# Patient Record
Sex: Male | Born: 1960 | Race: White | Hispanic: No | Marital: Married | Smoking: Former smoker
Health system: Southern US, Community
[De-identification: ages and names within clinical notes are randomized; demographics above are authoritative.]

## PROBLEM LIST (undated history)

## (undated) DIAGNOSIS — I10 Essential (primary) hypertension: Secondary | ICD-10-CM

## (undated) DIAGNOSIS — J45909 Unspecified asthma, uncomplicated: Secondary | ICD-10-CM

## (undated) DIAGNOSIS — J449 Chronic obstructive pulmonary disease, unspecified: Secondary | ICD-10-CM

---

## 2002-12-24 ENCOUNTER — Encounter: Admission: RE | Admit: 2002-12-24 | Discharge: 2002-12-24 | Payer: Self-pay | Admitting: Family Medicine

## 2002-12-24 ENCOUNTER — Encounter: Payer: Self-pay | Admitting: Family Medicine

## 2006-10-16 ENCOUNTER — Emergency Department: Payer: Self-pay | Admitting: Internal Medicine

## 2006-10-16 ENCOUNTER — Other Ambulatory Visit: Payer: Self-pay

## 2006-11-16 ENCOUNTER — Ambulatory Visit: Payer: Self-pay | Admitting: Gastroenterology

## 2011-12-13 ENCOUNTER — Ambulatory Visit: Payer: Self-pay | Admitting: Specialist

## 2013-05-30 ENCOUNTER — Ambulatory Visit: Payer: Self-pay | Admitting: Gastroenterology

## 2015-02-23 ENCOUNTER — Other Ambulatory Visit: Payer: Self-pay

## 2015-02-23 ENCOUNTER — Emergency Department: Payer: BLUE CROSS/BLUE SHIELD

## 2015-02-23 ENCOUNTER — Encounter: Payer: Self-pay | Admitting: Emergency Medicine

## 2015-02-23 ENCOUNTER — Emergency Department
Admission: EM | Admit: 2015-02-23 | Discharge: 2015-02-23 | Disposition: A | Discharge status: Home / Self Care | Payer: BLUE CROSS/BLUE SHIELD | Attending: Emergency Medicine | Admitting: Emergency Medicine

## 2015-02-23 DIAGNOSIS — R0981 Nasal congestion: Secondary | ICD-10-CM | POA: Diagnosis present

## 2015-02-23 DIAGNOSIS — J441 Chronic obstructive pulmonary disease with (acute) exacerbation: Secondary | ICD-10-CM | POA: Insufficient documentation

## 2015-02-23 DIAGNOSIS — I10 Essential (primary) hypertension: Secondary | ICD-10-CM | POA: Diagnosis not present

## 2015-02-23 DIAGNOSIS — J40 Bronchitis, not specified as acute or chronic: Secondary | ICD-10-CM

## 2015-02-23 HISTORY — DX: Essential (primary) hypertension: I10

## 2015-02-23 HISTORY — DX: Chronic obstructive pulmonary disease, unspecified: J44.9

## 2015-02-23 LAB — BASIC METABOLIC PANEL
Anion gap: 9 (ref 5–15)
BUN: 11 mg/dL (ref 6–20)
CHLORIDE: 103 mmol/L (ref 101–111)
CO2: 22 mmol/L (ref 22–32)
Calcium: 8.7 mg/dL — ABNORMAL LOW (ref 8.9–10.3)
Creatinine, Ser: 0.9 mg/dL (ref 0.61–1.24)
GFR calc Af Amer: >60 mL/min (ref >60)
Glucose, Bld: 121 mg/dL — ABNORMAL HIGH (ref 65–99)
POTASSIUM: 3.9 mmol/L (ref 3.5–5.1)
SODIUM: 134 mmol/L — AB (ref 135–145)

## 2015-02-23 LAB — CBC
HCT: 49.9 % (ref 40.0–52.0)
Hemoglobin: 17.1 g/dL (ref 13.0–18.0)
MCH: 32.5 pg (ref 26.0–34.0)
MCHC: 34.3 g/dL (ref 32.0–36.0)
MCV: 94.5 fL (ref 80.0–100.0)
Platelets: 146 10*3/uL — ABNORMAL LOW (ref 150–440)
RBC: 5.28 MIL/uL (ref 4.40–5.90)
RDW: 14.2 % (ref 11.5–14.5)
WBC: 6.6 10*3/uL (ref 3.8–10.6)

## 2015-02-23 MED ORDER — PREDNISONE 50 MG PO TABS: 50.0000 mg | ORAL_TABLET | Freq: Every day | ORAL | Status: AC

## 2015-02-23 MED ORDER — AZITHROMYCIN 500 MG PO TABS: 500.0000 mg | ORAL_TABLET | Freq: Every day | ORAL | Status: AC

## 2015-02-23 NOTE — ED Provider Notes (Signed)
[facility] Emergency Department Provider Note  ____________________________________________    I have reviewed the triage vital signs and the nursing notes.   HISTORY  Chief Complaint Shortness of Breath; Cough; and Nasal Congestion   HPI Tyler Dixon is a 54 y.o. male who presents with complaints of cough and nasal congestion. He reports he developed a cough approximately 2-3 weeks ago and felt better after antibiotics but it is returned now. He does admit to a history of smoking. Complains of some mild shortness of breath. No fevers no chills. He denies chest pain. The cough is productive     Past Medical History  Diagnosis Date  . COPD (chronic obstructive pulmonary disease)   . Hypertension     There are no active problems to display for this patient.   No past surgical history on file.  Current Outpatient Rx  Name  Route  Sig  Dispense  Refill  . azithromycin (ZITHROMAX) 500 MG tablet   Oral   Take 1 tablet (500 mg total) by mouth daily. Take 1 tablet daily for 3 days.   7 tablet   0   . predniSONE (DELTASONE) 50 MG tablet   Oral   Take 1 tablet (50 mg total) by mouth daily with breakfast.   7 tablet   0     Allergies Review of patient's allergies indicates no known allergies.  No family history on file.  Social History History  Substance Use Topics  . Smoking status: Former Games developer  . Smokeless tobacco: Not on file  . Alcohol Use: Yes    Review of Systems  Constitutional: Negative for fever. Eyes: Negative for visual changes. ENT: Negative for sore throat Cardiovascular: Negative for chest pain. Respiratory: Mild shortness of breath, and cough Gastrointestinal: Negative for abdominal pain, vomiting and diarrhea. Genitourinary: Negative for dysuria. Musculoskeletal: Negative for back pain. Skin: Negative for rash. Neurological: Negative for headaches or focal weakness Psychiatric: No  anxiety    ____________________________________________   PHYSICAL EXAM:  VITAL SIGNS: ED Triage Vitals  Enc Vitals Group     BP 02/23/15 1201 154/87 mmHg     Pulse Rate 02/23/15 1201 81     Resp 02/23/15 1201 18     Temp 02/23/15 1201 98.1 F (36.7 C)     Temp Source 02/23/15 1201 Oral     SpO2 02/23/15 1201 96 %     Weight 02/23/15 1201 310 lb (140.615 kg)     Height 02/23/15 1201 5\' 11"  (1.803 m)     Head Cir --      Peak Flow --      Pain Score --      Pain Loc --      Pain Edu? --      Excl. in GC? --      Constitutional: Alert and oriented. Well appearing and in no distress. Eyes: Conjunctivae are normal.  ENT   Head: Normocephalic and atraumatic.   Mouth/Throat: Mucous membranes are moist. Cardiovascular: Normal rate, regular rhythm. Normal and symmetric distal pulses are present in all extremities. No murmurs, rubs, or gallops. Respiratory: Normal respiratory effort without tachypnea nor retractions. Scattered wheezes Gastrointestinal: Soft and non-tender in all quadrants. No distention. There is no CVA tenderness. Genitourinary: deferred Musculoskeletal: Nontender with normal range of motion in all extremities. No lower extremity tenderness nor edema. Neurologic:  Normal speech and language. No gross focal neurologic deficits are appreciated. Skin:  Skin is warm, dry and intact. No rash noted. Psychiatric:  Mood and affect are normal. Patient exhibits appropriate insight and judgment.  ____________________________________________    LABS (pertinent positives/negatives)  Labs Reviewed  BASIC METABOLIC PANEL - Abnormal; Notable for the following:    Sodium 134 (*)    Glucose, Bld 121 (*)    Calcium 8.7 (*)    All other components within normal limits  CBC - Abnormal; Notable for the following:    Platelets 146 (*)    All other components within normal limits    ____________________________________________   EKG  ED ECG REPORT I, Jene Every, the attending physician, personally viewed and interpreted this ECG.  Date: 02/23/2015 EKG Time: 12:15 PM Rate: 77 Rhythm: normal sinus rhythm QRS Axis: normal Intervals: normal ST/T Wave abnormalities: normal Conduction Disutrbances: none Narrative Interpretation: unremarkable   ____________________________________________    RADIOLOGY I have personally reviewed any xrays that were ordered on this patient: Chest x-ray unremarkable   ____________________________________________   PROCEDURES  Procedure(s) performed: none  Critical Care performed: none  ____________________________________________   INITIAL IMPRESSION / ASSESSMENT AND PLAN / ED COURSE  Pertinent labs & imaging results that were available during my care of the patient were reviewed by me and considered in my medical decision making (see chart for details).  Patient well-appearing overall. We will give DuoNeb nebulized treatments in the emergency department. His blood pressure slightly elevated he does have a history of chronic hypertension. Last fall with PCP.  Will discharge him with prednisone and antibiotics with close follow-up as needed.  ____________________________________________   FINAL CLINICAL IMPRESSION(S) / ED DIAGNOSES  Final diagnoses:  Bronchitis     Jene Every, MD 02/23/15 [phone]

## 2015-02-23 NOTE — ED Notes (Signed)
C/o upper respiratory sxs cough and congestion.  Recently treated for bronchitis. Cough present.

## 2015-02-23 NOTE — Discharge Instructions (Signed)
Upper Respiratory Infection, Adult An upper respiratory infection (URI) is also sometimes known as the common cold. The upper respiratory tract includes the nose, sinuses, throat, trachea, and bronchi. Bronchi are the airways leading to the lungs. Most people improve within 1 week, but symptoms can last up to 2 weeks. A residual cough may last even longer.  CAUSES Many different viruses can infect the tissues lining the upper respiratory tract. The tissues become irritated and inflamed and often become very moist. Mucus production is also common. A cold is contagious. You can easily spread the virus to others by oral contact. This includes kissing, sharing a glass, coughing, or sneezing. Touching your mouth or nose and then touching a surface, which is then touched by another person, can also spread the virus. SYMPTOMS  Symptoms typically develop 1 to 3 days after you come in contact with a cold virus. Symptoms vary from person to person. They may include:  Runny nose.  Sneezing.  Nasal congestion.  Sinus irritation.  Sore throat.  Loss of voice (laryngitis).  Cough.  Fatigue.  Muscle aches.  Loss of appetite.  Headache.  Low-grade fever. DIAGNOSIS  You might diagnose your own cold based on familiar symptoms, since most people get a cold 2 to 3 times a year. Your caregiver can confirm this based on your exam. Most importantly, your caregiver can check that your symptoms are not due to another disease such as strep throat, sinusitis, pneumonia, asthma, or epiglottitis. Blood tests, throat tests, and X-rays are not necessary to diagnose a common cold, but they may sometimes be helpful in excluding other more serious diseases. Your caregiver will decide if any further tests are required. RISKS AND COMPLICATIONS  You may be at risk for a more severe case of the common cold if you smoke cigarettes, have chronic heart disease (such as heart failure) or lung disease (such as asthma), or if  you have a weakened immune system. The very young and very old are also at risk for more serious infections. Bacterial sinusitis, middle ear infections, and bacterial pneumonia can complicate the common cold. The common cold can worsen asthma and chronic obstructive pulmonary disease (COPD). Sometimes, these complications can require emergency medical care and may be life-threatening. PREVENTION  The best way to protect against getting a cold is to practice good hygiene. Avoid oral or hand contact with people with cold symptoms. Wash your hands often if contact occurs. There is no clear evidence that vitamin C, vitamin E, echinacea, or exercise reduces the chance of developing a cold. However, it is always recommended to get plenty of rest and practice good nutrition. TREATMENT  Treatment is directed at relieving symptoms. There is no cure. Antibiotics are not effective, because the infection is caused by a virus, not by bacteria. Treatment may include:  Increased fluid intake. Sports drinks offer valuable electrolytes, sugars, and fluids.  Breathing heated mist or steam (vaporizer or shower).  Eating chicken soup or other clear broths, and maintaining good nutrition.  Getting plenty of rest.  Using gargles or lozenges for comfort.  Controlling fevers with ibuprofen or acetaminophen as directed by your caregiver.  Increasing usage of your inhaler if you have asthma. Zinc gel and zinc lozenges, taken in the first 24 hours of the common cold, can shorten the duration and lessen the severity of symptoms. Pain medicines may help with fever, muscle aches, and throat pain. A variety of non-prescription medicines are available to treat congestion and runny nose. Your caregiver   can make recommendations and may suggest nasal or lung inhalers for other symptoms.  HOME CARE INSTRUCTIONS   Only take over-the-counter or prescription medicines for pain, discomfort, or fever as directed by your  caregiver.  Use a warm mist humidifier or inhale steam from a shower to increase air moisture. This may keep secretions moist and make it easier to breathe.  Drink enough water and fluids to keep your urine clear or pale yellow.  Rest as needed.  Return to work when your temperature has returned to normal or as your caregiver advises. You may need to stay home longer to avoid infecting others. You can also use a face mask and careful hand washing to prevent spread of the virus. SEEK MEDICAL CARE IF:   After the first few days, you feel you are getting worse rather than better.  You need your caregiver's advice about medicines to control symptoms.  You develop chills, worsening shortness of breath, or brown or red sputum. These may be signs of pneumonia.  You develop yellow or brown nasal discharge or pain in the face, especially when you bend forward. These may be signs of sinusitis.  You develop a fever, swollen neck glands, pain with swallowing, or white areas in the back of your throat. These may be signs of strep throat. SEEK IMMEDIATE MEDICAL CARE IF:   You have a fever.  You develop severe or persistent headache, ear pain, sinus pain, or chest pain.  You develop wheezing, a prolonged cough, cough up blood, or have a change in your usual mucus (if you have chronic lung disease).  You develop sore muscles or a stiff neck. Document Released: 12/28/2000 Document Revised: 09/26/2011 Document Reviewed: 10/09/2013 [employer] Patient Information 2015 [employer]. This information is not intended to replace advice given to you by your health care provider. Make sure you discuss any questions you have with your health care provider.  

## 2015-02-23 NOTE — ED Notes (Signed)
Patient reports for about the last week he has been having increased shortness of breath, cough, congestion with some production of yellow/white mucus. Patient reports history of bronchitis for which he has been treated but over the last week it seems to be getting worse. Patient has used nebulizer at home with little relief.

## 2015-05-25 ENCOUNTER — Other Ambulatory Visit: Payer: Self-pay | Admitting: Family Medicine

## 2015-05-25 ENCOUNTER — Ambulatory Visit
Admission: RE | Admit: 2015-05-25 | Discharge: 2015-05-25 | Disposition: A | Discharge status: Home / Self Care | Payer: BLUE CROSS/BLUE SHIELD | Source: Ambulatory Visit | Attending: Family Medicine | Admitting: Family Medicine

## 2015-05-25 DIAGNOSIS — D72825 Bandemia: Secondary | ICD-10-CM

## 2015-05-25 DIAGNOSIS — R1032 Left lower quadrant pain: Secondary | ICD-10-CM

## 2015-05-25 DIAGNOSIS — R10819 Abdominal tenderness, unspecified site: Secondary | ICD-10-CM | POA: Diagnosis present

## 2015-05-25 DIAGNOSIS — D72829 Elevated white blood cell count, unspecified: Secondary | ICD-10-CM | POA: Diagnosis present

## 2015-05-25 DIAGNOSIS — K5732 Diverticulitis of large intestine without perforation or abscess without bleeding: Secondary | ICD-10-CM | POA: Insufficient documentation

## 2015-05-25 DIAGNOSIS — K4090 Unilateral inguinal hernia, without obstruction or gangrene, not specified as recurrent: Secondary | ICD-10-CM | POA: Insufficient documentation

## 2015-05-25 DIAGNOSIS — R109 Unspecified abdominal pain: Secondary | ICD-10-CM | POA: Diagnosis present

## 2015-05-25 HISTORY — DX: Unspecified asthma, uncomplicated: J45.909

## 2015-05-25 MED ORDER — IOHEXOL 350 MG/ML SOLN
100.0000 mL | Freq: Once | INTRAVENOUS | Status: AC | PRN
  Administered 2015-05-25: 100 mL via INTRAVENOUS

## 2016-10-18 IMAGING — CT CT ABD-PELV W/ CM
1 of 3 series · 13 of 32 positions shown, 18 images · IV contrast (omnipaque)
Comparison: None.

CLINICAL DATA: Left lower quadrant and pelvic pain for 2-3 weeks.
Elevated white blood cell count.

EXAM:
CT ABDOMEN AND PELVIS WITH CONTRAST
TECHNIQUE: Multidetector CT imaging of the abdomen and pelvis was performed
using the standard protocol following bolus administration of
intravenous contrast.
CONTRAST:  100mL OMNIPAQUE IOHEXOL 350 MG/ML SOLN

[Series 2: routine abd pel with · axial · 0.94mm/px · z∈[-985,-565]mm · 13 of 94 slices shown, 18 images]
[im 5/94  soft-tissue]
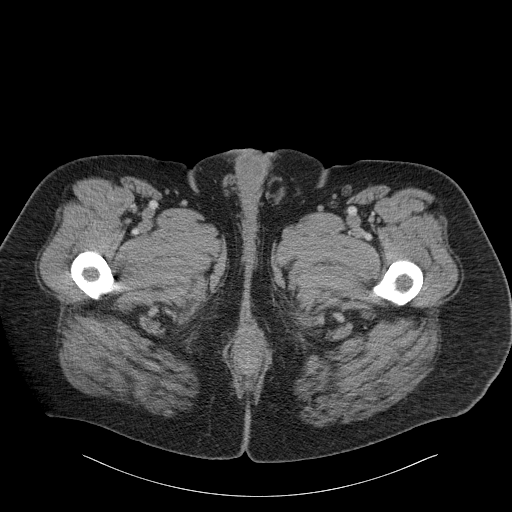
[im 5/94  bone]
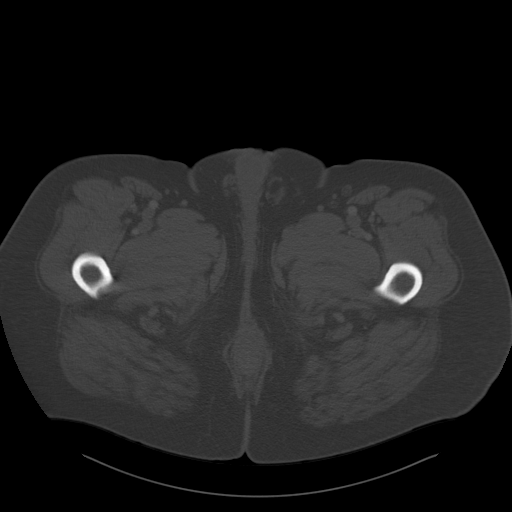
[im 15/94  soft-tissue]
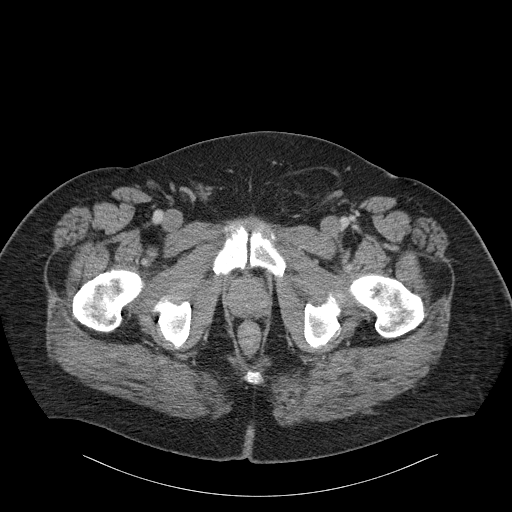
[im 20/94  soft-tissue]
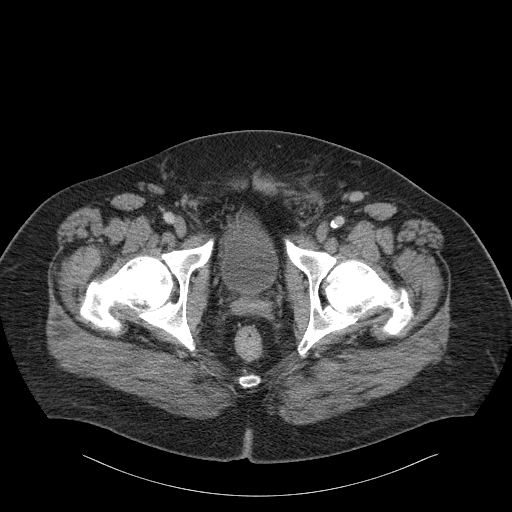
[im 30/94  soft-tissue]
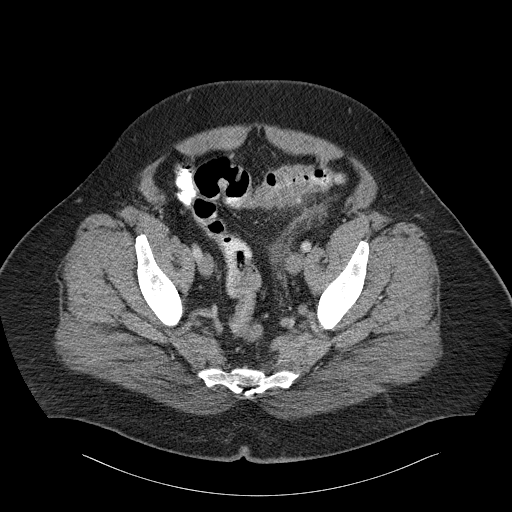
[im 35/94  soft-tissue]
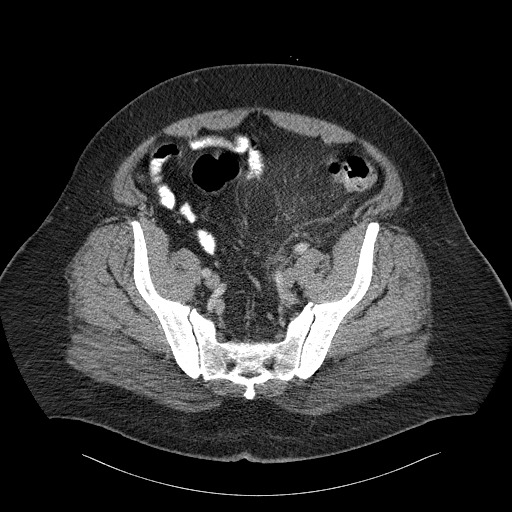
[im 45/94  soft-tissue]
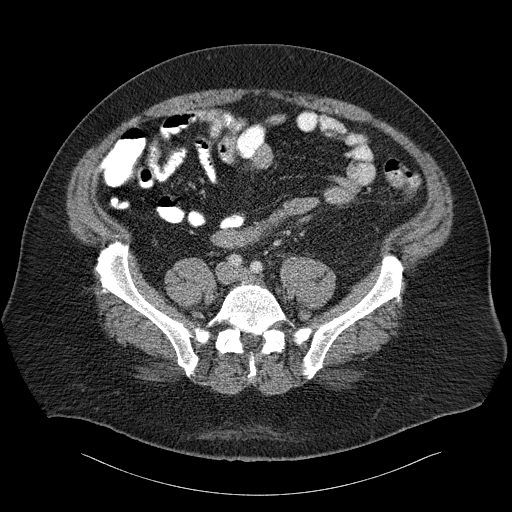
[im 49/94  soft-tissue]
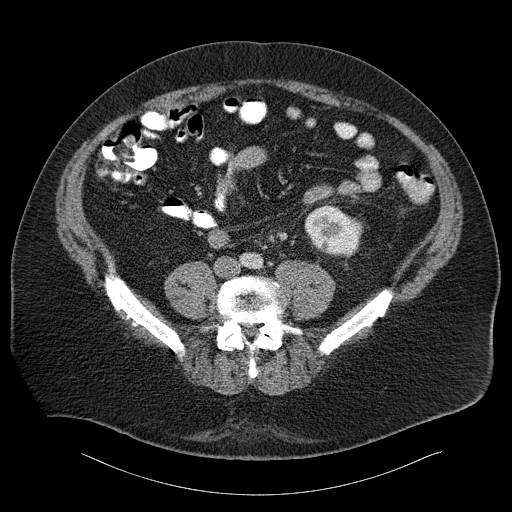
[im 59/94  soft-tissue]
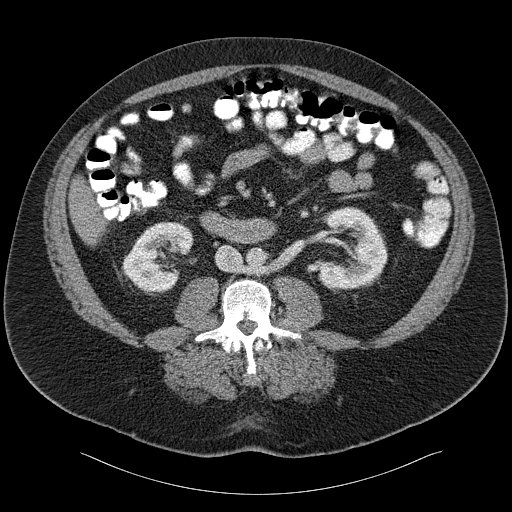
[im 64/94  soft-tissue]
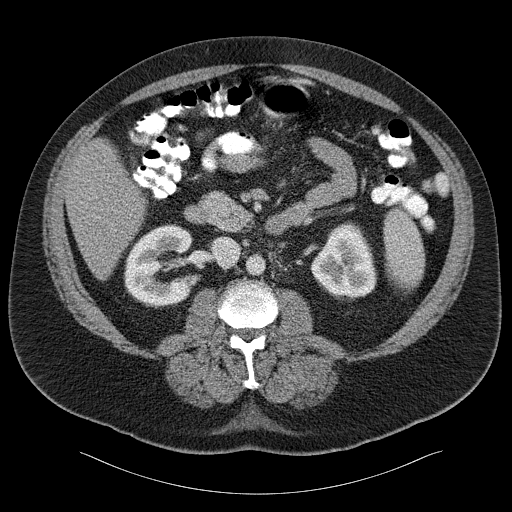
[im 64/94  bone]
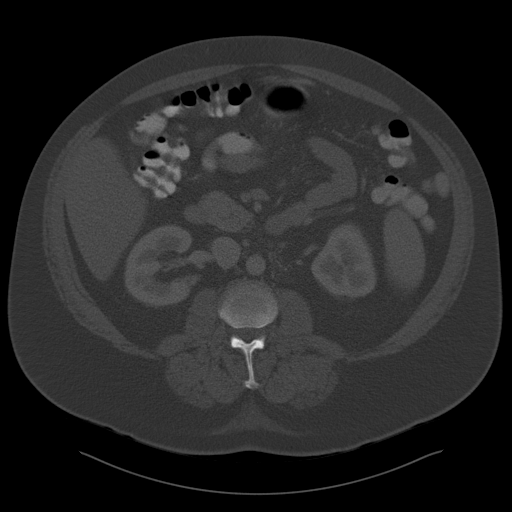
[im 74/94  soft-tissue]
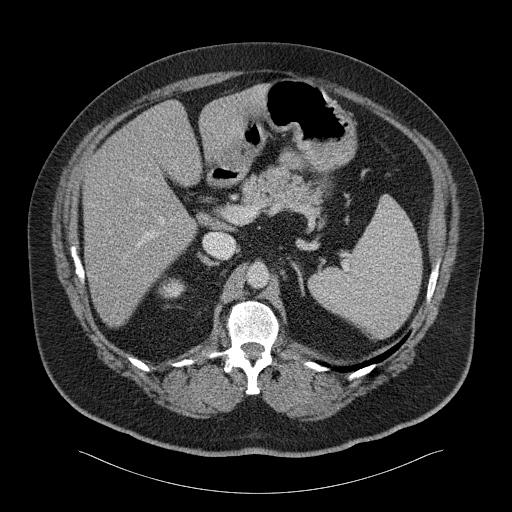
[im 74/94  lung]
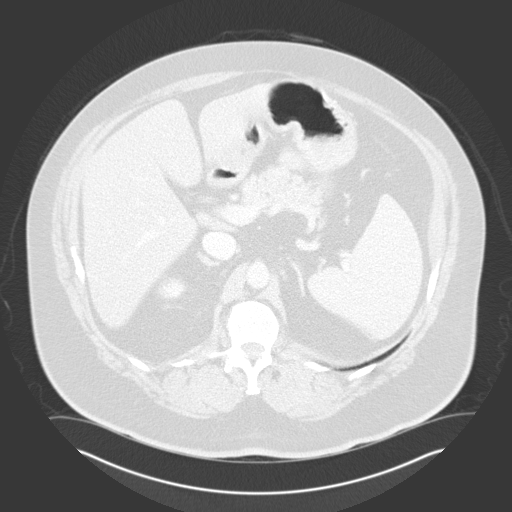
[im 79/94  soft-tissue]
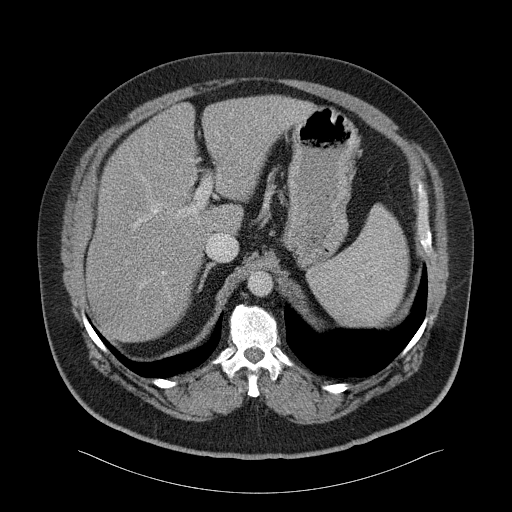
[im 79/94  lung]
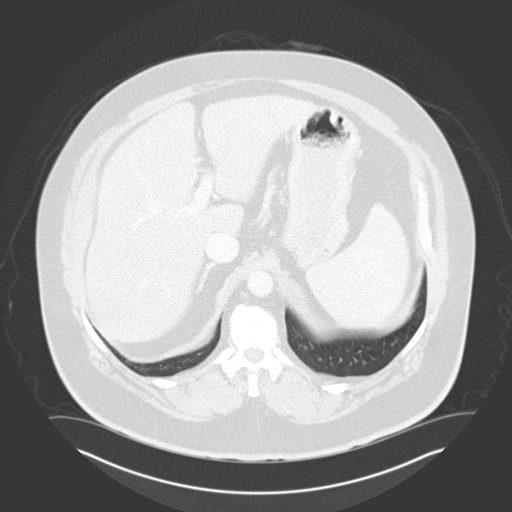
[im 84/94  lung]
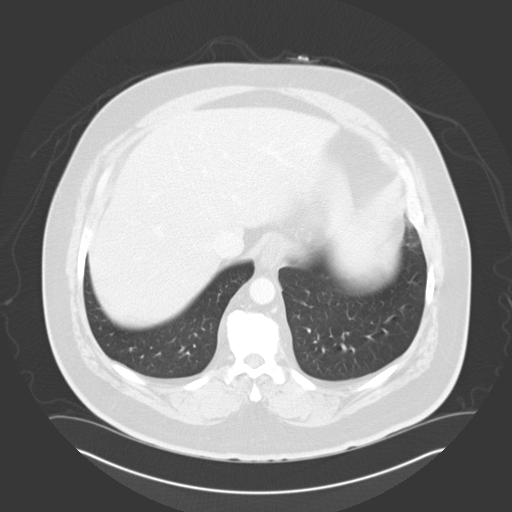
[im 89/94  soft-tissue]
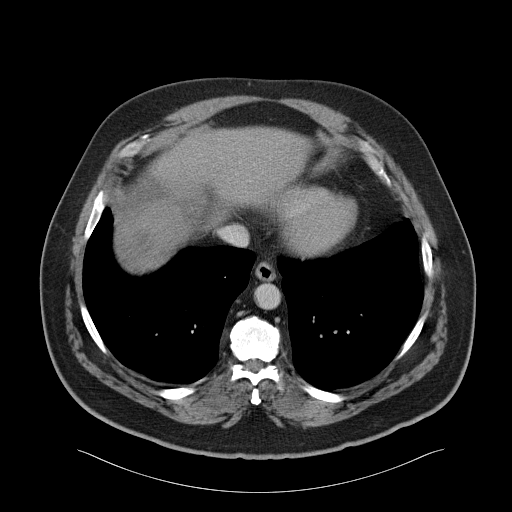
[im 89/94  lung]
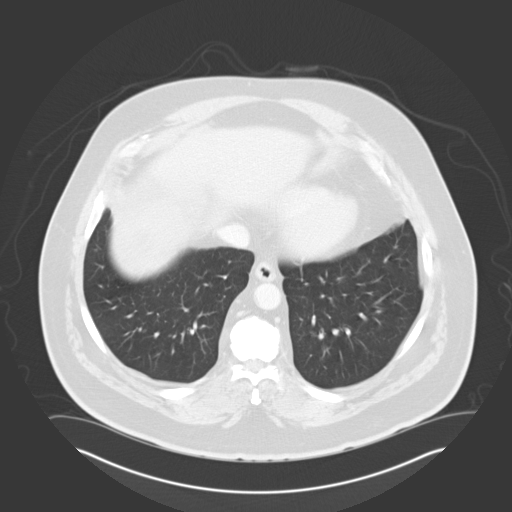

[13 of 32 positions shown; findings below may reference images not displayed]

FINDINGS: Lower chest: The lung bases are clear of acute process. No pleural
effusion or pulmonary lesions. The heart is normal in size. No
pericardial effusion. The distal esophagus and aorta are
unremarkable.

Hepatobiliary: No focal hepatic lesions or intrahepatic biliary
dilatation. The gallbladder is normal. No common bile duct
dilatation.

Pancreas: No mass, inflammation or ductal dilatation.

Spleen: Normal size.  No focal lesions.

Adrenals/Urinary Tract: The adrenal glands and kidneys are normal.
No worrisome renal lesions, renal calculi or obstructing ureteral
calculi. No bladder calculi or bladder mass. The prostate gland is
normal. The seminal vesicles are normal.

Stomach/Bowel: The stomach, duodenum and small bowel are
unremarkable. No inflammatory changes, mass lesions or obstructive
findings. The terminal ileum is normal. The appendix is normal.

There is acute uncomplicated diverticulitis involving the upper
sigmoid colon with marked wall thickening and pericolonic
inflammatory change. No abscess or free air. The rectum and
remainder of the sigmoid colon are unremarkable.

Vascular/Lymphatic: No mesenteric or retroperitoneal mass or
adenopathy. The aorta demonstrates scattered atherosclerotic
calcifications. No aneurysm or dissection. The branch vessels are
patent. The major venous structures are patent. A retroaortic left
renal vein is noted.

Other: No pelvic mass or adenopathy. No free pelvic fluid
collections. Very prominent left inguinal ring containing fat.

Musculoskeletal: No significant bony findings. Moderate degenerative
changes involving the lumbar spine.
IMPRESSION: 1. Acute uncomplicated diverticulitis involving the upper sigmoid
colon.
2. Left inguinal hernia containing fat.
